# Patient Record
Sex: Female | Born: 2000 | Race: White | Hispanic: Yes | Marital: Single
Health system: Southern US, Community
[De-identification: ages and names within clinical notes are randomized; demographics above are authoritative.]

## PROBLEM LIST (undated history)

## (undated) DIAGNOSIS — F419 Anxiety disorder, unspecified: Secondary | ICD-10-CM

## (undated) DIAGNOSIS — K219 Gastro-esophageal reflux disease without esophagitis: Secondary | ICD-10-CM

## (undated) DIAGNOSIS — E282 Polycystic ovarian syndrome: Secondary | ICD-10-CM

## (undated) DIAGNOSIS — T7840XA Allergy, unspecified, initial encounter: Secondary | ICD-10-CM

## (undated) DIAGNOSIS — J302 Other seasonal allergic rhinitis: Secondary | ICD-10-CM

## (undated) HISTORY — DX: Polycystic ovarian syndrome: E28.2

## (undated) HISTORY — DX: Anxiety disorder, unspecified: F41.9

## (undated) HISTORY — DX: Allergy, unspecified, initial encounter: T78.40XA

## (undated) HISTORY — DX: Gastro-esophageal reflux disease without esophagitis: K21.9

## (undated) HISTORY — DX: Other seasonal allergic rhinitis: J30.2

---

## 2019-05-02 LAB — BASIC METABOLIC PANEL
BUN: 12 (ref 4–21)
CO2: 24 — AB (ref 13–22)
Chloride: 104 (ref 99–108)
Creatinine: 0.7 (ref 0.5–1.1)
Glucose: 100
Potassium: 4.2 (ref 3.4–5.3)
Sodium: 139 (ref 137–147)

## 2019-05-02 LAB — CBC AND DIFFERENTIAL
HCT: 40 (ref 36–46)
Hemoglobin: 13.3 (ref 12.0–16.0)
Platelets: 401 — AB (ref 150–399)
WBC: 6.9

## 2019-05-02 LAB — LIPID PANEL
Cholesterol: 150 (ref 0–200)
HDL: 37 (ref 35–70)
LDL Cholesterol: 93
Triglycerides: 108 (ref 40–160)

## 2019-05-02 LAB — COMPREHENSIVE METABOLIC PANEL
Albumin: 3.9 (ref 3.5–5.0)
Calcium: 9.2 (ref 8.7–10.7)

## 2019-05-02 LAB — HEPATIC FUNCTION PANEL
ALT: 29 (ref 7–35)
AST: 18 (ref 13–35)
Alkaline Phosphatase: 45 (ref 25–125)
Bilirubin, Total: 0.3

## 2019-05-02 LAB — CBC: RBC: 4.61 (ref 3.87–5.11)

## 2019-05-02 LAB — TSH: TSH: 4.27 (ref 0.41–5.90)

## 2019-07-19 ENCOUNTER — Ambulatory Visit: Payer: Self-pay | Admitting: Family Medicine

## 2019-07-20 ENCOUNTER — Ambulatory Visit: Payer: Self-pay | Admitting: Family Medicine

## 2019-10-03 ENCOUNTER — Ambulatory Visit (INDEPENDENT_AMBULATORY_CARE_PROVIDER_SITE_OTHER): Payer: Managed Care, Other (non HMO) | Admitting: Family Medicine

## 2019-10-03 ENCOUNTER — Other Ambulatory Visit: Payer: Self-pay

## 2019-10-03 ENCOUNTER — Encounter: Payer: Self-pay | Admitting: Family Medicine

## 2019-10-03 VITALS — BP 128/72 | HR 78 | Temp 98.3°F | Ht 70.0 in | Wt 258.8 lb

## 2019-10-03 DIAGNOSIS — F419 Anxiety disorder, unspecified: Secondary | ICD-10-CM

## 2019-10-03 DIAGNOSIS — K219 Gastro-esophageal reflux disease without esophagitis: Secondary | ICD-10-CM

## 2019-10-03 DIAGNOSIS — M25511 Pain in right shoulder: Secondary | ICD-10-CM | POA: Diagnosis not present

## 2019-10-03 DIAGNOSIS — M67432 Ganglion, left wrist: Secondary | ICD-10-CM

## 2019-10-03 DIAGNOSIS — Z23 Encounter for immunization: Secondary | ICD-10-CM

## 2019-10-03 DIAGNOSIS — E282 Polycystic ovarian syndrome: Secondary | ICD-10-CM

## 2019-10-03 DIAGNOSIS — L309 Dermatitis, unspecified: Secondary | ICD-10-CM | POA: Diagnosis not present

## 2019-10-03 DIAGNOSIS — J302 Other seasonal allergic rhinitis: Secondary | ICD-10-CM | POA: Insufficient documentation

## 2019-10-03 MED ORDER — MELOXICAM 7.5 MG PO TABS: 7.5000 mg | ORAL_TABLET | Freq: Every day | ORAL | 0 refills | Status: DC

## 2019-10-03 MED ORDER — OMEPRAZOLE 20 MG PO CPDR: 20.0000 mg | DELAYED_RELEASE_CAPSULE | Freq: Every day | ORAL | 3 refills | Status: DC

## 2019-10-03 NOTE — Progress Notes (Signed)
Tamara Jimenez DOB: 27-Mar-2000 Encounter date: 10/03/2019  This is a 19 y.o. female who presents to establish care. Chief Complaint  Patient presents with  . New Patient (Initial Visit)    History of present illness: Moved from [place] a few months ago.   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   Acid reflux - she is pretty sure she has this no matter what she eats - burping. Before used to be more with acidic foods and now pretty much anything. pepcid helps. Just burping. Feels more acid with tomato based.   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 When sleeping on shoulder feels deep pain and starts in shoulder but then throbs down arm. No numbness, tingling. Sometimes feels a little weakness just if carrying bag. No neck pain. Bothering her since July. When shoulder starts to hurt she takes tylenol which helps after awhile. If she sleeps on left side, shoulder does ok.   Allergic to dogs, cats, pecans, dust. Follows with allergy regularly. She is taking levocetirizine, montelukast, flonase. Does have albuterol, but has never used inhaler. Just given to her in case resp flare from allergies.   Mom passed when she was 89 y/o. Moved to [place] with Dad. Dad was on dialysis, had diabetes. Found father deceased in last year. She has anxiety, but feels she has some form of depression. Also been told some PTSD. Has regularly been in therapy. Sleeping ok.   Has lost almost 50lbs since she moved here. She has cut out some of the binge eating that she was doing; eating healthier in general and eating less. She walks dog daily and gets a lot of activity at work.  Past Medical History:  Diagnosis Date  . Allergy   . Anxiety   . GERD (gastroesophageal reflux disease)   . PCOS (polycystic ovarian syndrome)   . Seasonal allergies    History reviewed. No pertinent surgical history. Allergies  Allergen Reactions  . Dairy Aid [Lactase] Diarrhea    Current Meds  Medication Sig  . albuterol (VENTOLIN HFA) 108 (90 Base) MCG/ACT inhaler Inhale into the lungs every 6 (six) hours as needed for wheezing or shortness of breath.  Marland Kitchen azelastine (OPTIVAR) 0.05 % ophthalmic solution 1 drop 2 (two) times daily.  Marland Kitchen EPINEPHrine 0.3 mg/0.3 mL IJ SOAJ injection Inject 0.3 mg into the muscle as needed for anaphylaxis.  Marland Kitchen levocetirizine (XYZAL) 5 MG tablet Take 5 mg by mouth every evening.  . montelukast (SINGULAIR) 10 MG tablet Take 10 mg by mouth at bedtime.  . [DISCONTINUED] fluticasone (FLOVENT DISKUS) 50 MCG/BLIST diskus inhaler Inhale 1 puff into the lungs 2 (two) times daily.   Social History   Tobacco Use  . Smoking status: Not on file  Substance Use Topics  . Alcohol use: Not on file   Family History  Problem Relation Age of Onset  . Diabetes Mother   . Heart attack Mother 44  . Hypertension Mother   . Miscarriages / [place] Mother   . Diabetes Father   . Kidney failure Father   . Heart disease Father   . Diabetes Maternal Grandmother   . Hypertension Maternal Grandmother   . Diabetes Paternal Grandmother   . Hypertension Paternal Grandmother      Review of Systems  Constitutional: Negative for chills, fatigue and fever.  Respiratory: Negative for cough, chest tightness, shortness of breath and wheezing.   Cardiovascular: Negative for chest pain, palpitations and leg  swelling.  Gastrointestinal: Negative for abdominal pain, blood in stool, constipation, nausea and vomiting.  Musculoskeletal: Positive for arthralgias.    Objective:  BP 128/72 (BP Location: Right Arm, Patient Position: Sitting, Cuff Size: Large)   Pulse 78   Temp 98.3 F (36.8 C) (Oral)   Ht 5\' 10"  (1.778 m)   Wt 258 lb 12.8 oz (117.4 kg)   LMP 07/21/2019   SpO2 96%   BMI 37.13 kg/m   Weight: 258 lb 12.8 oz (117.4 kg)   BP Readings from Last 3 Encounters:  10/03/19 128/72   Wt Readings from Last 3 Encounters:  10/03/19 258 lb 12.8 oz (117.4  kg)    Physical Exam Constitutional:      General: She is not in acute distress.    Appearance: She is well-developed.  Cardiovascular:     Rate and Rhythm: Normal rate and regular rhythm.     Heart sounds: Normal heart sounds. No murmur heard.  No friction rub.  Pulmonary:     Effort: Pulmonary effort is normal. No respiratory distress.     Breath sounds: Normal breath sounds. No wheezing or rales.  Abdominal:     General: Bowel sounds are normal. There is no distension.     Palpations: Abdomen is soft.     Tenderness: There is no abdominal tenderness. There is no guarding.  Musculoskeletal:     Right lower leg: No edema.     Left lower leg: No edema.     Comments: Normal shoulder exam bilat. She has full rom of shoulders. Full strength. There is minimal discomfort with posterior and extension of shoulder, but otherwise unable to elicit pain. No external abnormality or bony tenderness.  Skin:    Comments: Ganglion cyst left wrist  Neurological:     Mental Status: She is alert and oriented to person, place, and time.  Psychiatric:        Mood and Affect: Mood normal.        Behavior: Behavior normal.        Thought Content: Thought content normal.     Assessment/Plan:  1. Gastroesophageal reflux disease, unspecified whether esophagitis present Trial prilosec 20mg  x 1 month.  2. Eczema, unspecified type Stable without medication. Ok to send in steroid cream if needed for flares in future. Was using hydrocortisone from derm in past.  3. Flu vaccine need - Flu Vaccine QUAD 6+ mos PF IM (Fluarix Quad PF)  4. Right shoulder pain, unspecified chronicity Trial meloxicam x 2 weeks. Trying to avoid regular anti-inflammatories due to acid reflux. Take this with the omeprazole. Don't lay on right shoulder for sleep. Let me know if no improvement in 2 weeks time. Consider xray.  5. Ganglion cyst of dorsum of left wrist Benign; not painful. Let 10/05/19 know if enlarging or becomes  tender.  6. PCOS (polycystic ovarian syndrome) Not been on medication for this in the past; she is working on healthier eating, weight loss. We will monitor periods for regularity.  7. Seasonal allergies Continue with Singulair, Xyzal  8. Anxiety Currently stable.  Numbers given for [facility].  We will continue to discuss this and consider treatment if needed.  She has had a lot of stress including the loss of both parents short timeframe.  She does have a good support system with family in this area.  She is resilient and has gone on to work and complete schooling as well as work on healthier lifestyle.  Return in about 3  months (around 01/02/2020) for physical exam.  Theodis Shove, MD

## 2019-10-03 NOTE — Patient Instructions (Addendum)
Let me know if you don't hear from me in 2 weeks regarding gynecology note/lab review  Take the omeprazole 30 minutes prior to dinner.

## 2019-10-05 ENCOUNTER — Encounter: Payer: Self-pay | Admitting: Family Medicine

## 2019-10-08 ENCOUNTER — Telehealth: Payer: Self-pay | Admitting: *Deleted

## 2019-10-08 MED ORDER — FLUTICASONE PROPIONATE 50 MCG/ACT NA SUSP: 2.0000 | Freq: Every day | NASAL | 6 refills | Status: AC

## 2019-10-08 NOTE — Telephone Encounter (Signed)
-----   Message from Wynn Banker, MD sent at 10/08/2019  2:46 PM EDT ----- Can you please call her to set up a visit to discuss medication for anxiety?  Virtual or phone visit is fine.  She is interested in starting something and I do not believe she is taking anything before, so wanted to talk through options with her before prescribing something.

## 2019-10-08 NOTE — Telephone Encounter (Signed)
Left a detailed message at the pts cell number to schedule an appt as below.   

## 2019-10-18 ENCOUNTER — Ambulatory Visit: Payer: Managed Care, Other (non HMO) | Admitting: Psychology

## 2019-10-26 ENCOUNTER — Telehealth: Payer: Managed Care, Other (non HMO) | Admitting: Family Medicine

## 2019-10-26 NOTE — Progress Notes (Deleted)
Virtual Visit via Video Note  I connected with Tamara Jimenez  on 10/26/19 at 11:30 AM EDT by a video enabled telemedicine application and verified that I am speaking with the correct person using two identifiers.  Location patient: home Location provider: [facility]  [address] [place], [place] [address] Persons participating in the virtual visit: patient, provider  I discussed the limitations of evaluation and management by telemedicine and the availability of in person appointments. The patient expressed understanding and agreed to proceed.   Tamara Jimenez DOB: Apr 30, 2000 Encounter date: 10/26/2019  This is a 19 y.o. female who presents with No chief complaint on file.   History of present illness: She had a visit to establish care on 9/29.  We briefly talked about anxiety and depression at that visit since she had encountered significant loss with mother passing away at the age of 106 and father recently passing away.  She messaged me regarding anxiety treatment feeling that maybe she needed something additional to therapy. HPI   Allergies  Allergen Reactions  . Dairy Aid [Lactase] Diarrhea   No outpatient medications have been marked as taking for the 10/26/19 encounter (Appointment) with Wynn Banker, MD.    Review of Systems  Objective:  There were no vitals taken for this visit.      BP Readings from Last 3 Encounters:  10/03/19 128/72   Wt Readings from Last 3 Encounters:  10/03/19 258 lb 12.8 oz (117.4 kg)    EXAM:  GENERAL: alert, oriented, appears well and in no acute distress  HEENT: atraumatic, conjunctiva clear, no obvious abnormalities on inspection of external nose and ears  NECK: normal movements of the head and neck  LUNGS: on inspection no signs of respiratory distress, breathing rate appears normal, no obvious gross SOB, gasping or wheezing  CV: no obvious cyanosis  MS: moves all visible extremities  without noticeable abnormality  PSYCH/NEURO: pleasant and cooperative, no obvious depression or anxiety, speech and thought processing grossly intact ***  Assessment/Plan  There are no diagnoses linked to this encounter.     I discussed the assessment and treatment plan with the patient. The patient was provided an opportunity to ask questions and all were answered. The patient agreed with the plan and demonstrated an understanding of the instructions.   The patient was advised to call back or seek an in-person evaluation if the symptoms worsen or if the condition fails to improve as anticipated.  I provided *** minutes of non-face-to-face time during this encounter.   Theodis Shove, MD

## 2019-10-31 ENCOUNTER — Encounter: Payer: Self-pay | Admitting: Family Medicine

## 2019-10-31 ENCOUNTER — Ambulatory Visit: Payer: Managed Care, Other (non HMO) | Admitting: Psychology

## 2019-10-31 DIAGNOSIS — M25511 Pain in right shoulder: Secondary | ICD-10-CM

## 2019-11-01 ENCOUNTER — Ambulatory Visit: Payer: Managed Care, Other (non HMO)

## 2019-11-01 DIAGNOSIS — M25511 Pain in right shoulder: Secondary | ICD-10-CM

## 2019-11-01 MED ORDER — OMEPRAZOLE 20 MG PO CPDR
20.0000 mg | DELAYED_RELEASE_CAPSULE | Freq: Two times a day (BID) | ORAL | 2 refills | Status: DC
Start: 2019-11-01 — End: 2019-12-14

## 2019-11-07 ENCOUNTER — Other Ambulatory Visit: Payer: Managed Care, Other (non HMO)

## 2019-11-07 ENCOUNTER — Other Ambulatory Visit: Payer: Self-pay | Admitting: Family Medicine

## 2019-11-07 ENCOUNTER — Ambulatory Visit (INDEPENDENT_AMBULATORY_CARE_PROVIDER_SITE_OTHER): Payer: Managed Care, Other (non HMO)

## 2019-11-07 ENCOUNTER — Other Ambulatory Visit: Payer: Self-pay

## 2019-11-07 DIAGNOSIS — M25511 Pain in right shoulder: Secondary | ICD-10-CM

## 2019-11-14 ENCOUNTER — Telehealth (INDEPENDENT_AMBULATORY_CARE_PROVIDER_SITE_OTHER): Payer: Managed Care, Other (non HMO) | Admitting: Family Medicine

## 2019-11-14 VITALS — Wt 251.0 lb

## 2019-11-14 DIAGNOSIS — F419 Anxiety disorder, unspecified: Secondary | ICD-10-CM

## 2019-11-14 DIAGNOSIS — M25511 Pain in right shoulder: Secondary | ICD-10-CM

## 2019-11-14 MED ORDER — CITALOPRAM HYDROBROMIDE 20 MG PO TABS: 20.0000 mg | ORAL_TABLET | Freq: Every day | ORAL | 2 refills | Status: DC

## 2019-11-14 NOTE — Progress Notes (Signed)
Virtual Visit via Video Note  I connected with Tamara Jimenez  on 11/14/19 at  1:30 PM EST by a video enabled telemedicine application and verified that I am speaking with the correct person using two identifiers.  Location patient: home Location provider: [facility]  [address] [place], [place] [address] Persons participating in the virtual visit: patient, provider  I discussed the limitations of evaluation and management by telemedicine and the availability of in person appointments. The patient expressed understanding and agreed to proceed.   Tamara Jimenez DOB: 12/01/2000 Encounter date: 11/14/2019  This is a 19 y.o. female who presents with Chief Complaint  Patient presents with  . Shoulder Pain  . Anxiety    History of present illness: Things are going ok for her, but wanted to talk through normal xray results for shoulder.   Done taking meloxicam; wasn't really helping. Has trying to prop self on pillows to avoid turning/lying on shoulder. 2-4 weeks ago slept on shoulder and it was painful. Mostly at night that she notes it. 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 Feels achy - and just with lifting. If sitting still doesn't bother her. Sometimes painful with posterior reaching. Only weak when pain starts. Then it bothers her to even open and close fingers. Starts in lateral shoulder and then radiates down to hand. Pulses down arm. When it flares it will last for an hour and then go away. Has had numbness in fingers. Notes this in all of her fingers. Carrying purse on left shoulder. It is fairly intense when it comes - 7/10 scale. Has to stop what she is doing when it occurs.   Has still more severe moments of anxiety; worries when she has to take out trash. Has flash back/remembering scenes from finding dad. Tries to act more confident in times when she is anxious. Tries to stay focused. If chore takes longer then she is more worried. With visions  she is getting; she tries to ignore visions. Brain wants her to think about it; hard to push out of brain. Weighted blanket helps her fall asleep. Work is good; doesn't have issue with anxiety at work. Hasn't started school yet; just registering for classes. No excessive anxiety with regards to this.   Walking around at work a lot.   Allergies  Allergen Reactions  . Dairy Aid [Lactase] Diarrhea   No outpatient medications have been marked as taking for the 11/14/19 encounter (Video Visit) with Wynn Banker, MD.    Review of Systems  Constitutional: Negative for chills, fatigue and fever.  Respiratory: Negative for cough, chest tightness, shortness of breath and wheezing.   Cardiovascular: Negative for chest pain, palpitations and leg swelling.    Objective:  Wt 251 lb (113.9 kg)   BMI 36.01 kg/m   Weight: 251 lb (113.9 kg)   BP Readings from Last 3 Encounters:  10/03/19 128/72   Wt Readings from Last 3 Encounters:  11/14/19 251 lb (113.9 kg)  10/03/19 258 lb 12.8 oz (117.4 kg)    EXAM:  GENERAL: alert, oriented, appears well and in no acute distress  HEENT: atraumatic, conjunctiva clear, no obvious abnormalities on inspection of external nose and ears  NECK: normal movements of the head and neck  LUNGS: on inspection no signs of respiratory distress, breathing rate appears normal, no obvious gross SOB, gasping or wheezing  CV: no obvious cyanosis  MS: moves all visible extremities without noticeable abnormality  PSYCH/NEURO: pleasant and cooperative, no  obvious depression or anxiety, speech and thought processing grossly intact GAD 7 : Generalized Anxiety Score 11/14/2019  Nervous, Anxious, on Edge 1  Control/stop worrying 2  Worry too much - different things 1  Trouble relaxing 2  Restless 0  Easily annoyed or irritable 2  Afraid - awful might happen 2  Total GAD 7 Score 10  Anxiety Difficulty Somewhat difficult      Assessment/Plan  1. Right  shoulder pain, unspecified chronicity Pain sounds slightly more complicated at this point and I suspect some nerve involvement. I am going to send for further eval with sports med.  - Ambulatory referral to Sports Medicine  2. Anxiety She does have appointment with new therapist next week. I have asked her to let me know how this goes. Anxiety is prominent and we are going to try citalopram to help with some of more intrusive thoughts/anxiety control overall. She is using therapy techniques to help with coping. Discussed new medication(s) today with patient. Discussed potential side effects and patient verbalized understanding. She will update me in a couple of weeks to let me know how she is doing with medication and we will plan follow up pending this.      I discussed the assessment and treatment plan with the patient. The patient was provided an opportunity to ask questions and all were answered. The patient agreed with the plan and demonstrated an understanding of the instructions.   The patient was advised to call back or seek an in-person evaluation if the symptoms worsen or if the condition fails to improve as anticipated.  I provided 35 minutes of non-face-to-face time during this encounter.   Theodis Shove, MD

## 2019-11-22 ENCOUNTER — Ambulatory Visit (INDEPENDENT_AMBULATORY_CARE_PROVIDER_SITE_OTHER): Payer: Managed Care, Other (non HMO) | Admitting: Psychology

## 2019-11-22 DIAGNOSIS — F4312 Post-traumatic stress disorder, chronic: Secondary | ICD-10-CM

## 2019-11-27 ENCOUNTER — Encounter: Payer: Self-pay | Admitting: Family Medicine

## 2019-11-28 ENCOUNTER — Telehealth: Payer: Self-pay | Admitting: Family Medicine

## 2019-11-28 NOTE — Telephone Encounter (Signed)
Patient called and stated that she is returning the call, please advise. CB is [phone]

## 2019-12-05 ENCOUNTER — Encounter: Payer: Self-pay | Admitting: *Deleted

## 2019-12-06 ENCOUNTER — Ambulatory Visit: Payer: Managed Care, Other (non HMO) | Admitting: Psychology

## 2019-12-06 NOTE — Telephone Encounter (Signed)
See results note from 12/1.

## 2019-12-07 ENCOUNTER — Other Ambulatory Visit: Payer: Self-pay | Admitting: Family Medicine

## 2019-12-14 ENCOUNTER — Other Ambulatory Visit: Payer: Self-pay | Admitting: *Deleted

## 2019-12-14 MED ORDER — OMEPRAZOLE 20 MG PO CPDR
20.0000 mg | DELAYED_RELEASE_CAPSULE | Freq: Two times a day (BID) | ORAL | 0 refills | Status: DC
Start: 2019-12-14 — End: 2023-02-09

## 2019-12-14 NOTE — Telephone Encounter (Signed)
Rx done. 

## 2019-12-18 NOTE — Progress Notes (Signed)
[facility] [address] [place] [address] Phone: [phone] Subjective:   Tamara Jimenez, am serving as a scribe for Dr. Antoine Primas. This visit occurred during the SARS-CoV-2 public health emergency.  Safety protocols were in place, including screening questions prior to the visit, additional usage of staff PPE, and extensive cleaning of exam room while observing appropriate contact time as indicated for disinfecting solutions.   I'm seeing this patient by the request  of:  Wynn Banker, MD  CC: Right shoulder pain  LSL:[id number]  Tamara Jimenez is a 19 y.o. female coming in with complaint of right shoulder pain. Had xray 11/2019. Normal exam. 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 Patient notes pain while sleeping since July 2021. No longer works at [employer] clinic. Pain radiates down arm into hand. Used meloxicam from her PCP.   Xray 11/07/2019     Past Medical History:  Diagnosis Date  . Allergy   . Anxiety   . GERD (gastroesophageal reflux disease)   . PCOS (polycystic ovarian syndrome)   . Seasonal allergies    No past surgical history on file. Social History   Socioeconomic History  . Marital status: Single    Spouse name: Not on file  . Number of children: Not on file  . Years of education: Not on file  . Highest education level: Not on file  Occupational History  . Not on file  Tobacco Use  . Smoking status: Not on file  . Smokeless tobacco: Not on file  Substance and Sexual Activity  . Alcohol use: Not on file  . Drug use: Not on file  . Sexual activity: Not on file  Other Topics Concern  . Not on file  Social History Narrative  . Not on file   Social Determinants of Health   Financial Resource Strain: Not on file  Food Insecurity: Not on file  Transportation Needs: Not on file  Physical Activity: Not on file  Stress:  Not on file  Social Connections: Not on file   Allergies  Allergen Reactions  . Dairy Aid [Lactase] Diarrhea   Family History  Problem Relation Age of Onset  . Diabetes Mother   . Heart attack Mother 29  . Hypertension Mother   . Miscarriages / [place] Mother   . Diabetes Father   . Kidney failure Father   . Heart disease Father   . Diabetes Maternal Grandmother   . Hypertension Maternal Grandmother   . Diabetes Paternal Grandmother   . Hypertension Paternal Grandmother      Current Outpatient Medications (Cardiovascular):  Marland Kitchen  EPINEPHrine 0.3 mg/0.3 mL IJ SOAJ injection, Inject 0.3 mg into the muscle as needed for anaphylaxis.  Current Outpatient Medications (Respiratory):  .  albuterol (VENTOLIN HFA) 108 (90 Base) MCG/ACT inhaler, Inhale into the lungs every 6 (six) hours as needed for wheezing or shortness of breath. .  fluticasone (FLONASE) 50 MCG/ACT nasal spray, Place 2 sprays into both nostrils daily. Marland Kitchen  levocetirizine (XYZAL) 5 MG tablet, Take 5 mg by mouth every evening. .  montelukast (SINGULAIR) 10 MG tablet, Take 10 mg by mouth at bedtime.    Current Outpatient Medications (Other):  .  azelastine (OPTIVAR) 0.05 % ophthalmic solution, 1 drop 2 (two) times daily. .  citalopram (CELEXA) 20 MG tablet, TAKE 1 TABLET BY MOUTH EVERY DAY .  omeprazole (PRILOSEC) 20 MG  capsule, Take 1 capsule (20 mg total) by mouth 2 (two) times daily before a meal.   Reviewed prior external information including notes and imaging from  primary care provider As well as notes that were available from care everywhere and other healthcare systems.  Past medical history, social, surgical and family history all reviewed in electronic medical record.  No pertanent information unless stated regarding to the chief complaint.   Review of Systems:  No headache, visual changes, nausea, vomiting, diarrhea, constipation, dizziness, abdominal pain, skin rash, fevers, chills, night sweats,  weight loss, swollen lymph nodes, body aches, joint swelling, chest pain, shortness of breath, mood changes. POSITIVE muscle aches  Objective  Blood pressure 124/76, pulse 70, height 5\' 10"  (1.778 m), weight 254 lb (115.2 kg), SpO2 99 %.   General: No apparent distress alert and oriented x3 mood and affect normal, dressed appropriately.  HEENT: Pupils equal, extraocular movements intact  Respiratory: Patient's speak in full sentences and does not appear short of breath  Cardiovascular: No lower extremity edema, non tender, no erythema  Neuro: Cranial nerves II through XII are intact, neurovascularly intact in all extremities with 2+ DTRs and 2+ pulses.  Gait normal with good balance and coordination.  MSK:   Shoulder: Right Inspection reveals no abnormalities, atrophy or asymmetry. Palpation is normal with no tenderness over AC joint or bicipital groove. ROM is full in all planes passively. Rotator cuff strength normal throughout. signs of impingement with positive Neer and Hawkin's tests, but negative empty can sign. Speeds and Yergason's tests normal. No labral pathology noted with negative Obrien's, negative clunk and good stability. Normal scapular function observed. No painful arc and no drop arm sign. No apprehension sign  MSK performed of: Right This study was ordered, performed, and interpreted by [place] D.O.  Shoulder:   Supraspinatus:  Appears normal on long and transverse views, very mild bursal bulge seen with shoulder abduction on impingement view. AC joint:  Capsule undistended, no geyser sign. Glenohumeral Joint:  Appears normal without effusion. Glenoid Labrum:  Intact without visualized tears. Biceps Tendon:  Appears normal on long and transverse views, no fraying of tendon, tendon located in intertubercular groove, no subluxation with shoulder internal or external rotation.  Impression: Mild subacromial bursitis versus very mild rotator cuff  tendinitis  [id number]; 15 additional minutes spent for Therapeutic exercises as stated in above notes.  This included exercises focusing on stretching, strengthening, with significant focus on eccentric aspects.   Long term goals include an improvement in range of motion, strength, endurance as well as avoiding reinjury. Patient's frequency would include in 1-2 times a day, 3-5 times a week for a duration of 6-12 weeks. Shoulder Exercises that included:  Basic scapular stabilization to include adduction and depression of scapula Scaption, focusing on proper movement and good control Internal and External rotation utilizing a theraband, with elbow tucked at side entire time Rows with theraband    Proper technique shown and discussed handout in great detail with ATC.  All questions were discussed and answered.        Impression and Recommendations:     The above documentation has been reviewed and is accurate and complete Terrilee Files, DO

## 2019-12-19 ENCOUNTER — Ambulatory Visit: Payer: Self-pay

## 2019-12-19 ENCOUNTER — Ambulatory Visit: Payer: Managed Care, Other (non HMO) | Admitting: Family Medicine

## 2019-12-19 ENCOUNTER — Encounter: Payer: Self-pay | Admitting: Family Medicine

## 2019-12-19 ENCOUNTER — Other Ambulatory Visit: Payer: Self-pay

## 2019-12-19 VITALS — BP 124/76 | HR 70 | Ht 70.0 in | Wt 254.0 lb

## 2019-12-19 DIAGNOSIS — M25511 Pain in right shoulder: Secondary | ICD-10-CM

## 2019-12-19 NOTE — Assessment & Plan Note (Signed)
Right shoulder pain.  Patient does have more of a mild tendinitis.  I do think some of his posterior medial cuneiform exercises given, discussed vitamin D and topical anti-inflammatories.  Discussed icing regimen.  Follow-up in 6 weeks

## 2019-12-19 NOTE — Patient Instructions (Signed)
Vit D 2000IU daily Voltaren gel 2x a day as needed, fingertip sized amount Exercises 3x a week See me in 6 weeks

## 2019-12-20 ENCOUNTER — Telehealth: Payer: Self-pay | Admitting: *Deleted

## 2019-12-20 ENCOUNTER — Ambulatory Visit: Payer: Managed Care, Other (non HMO) | Admitting: Psychology

## 2019-12-20 NOTE — Telephone Encounter (Signed)
[facility] faxed a new Rx request for Estarylla.  Message sent to PCP.

## 2019-12-24 NOTE — Telephone Encounter (Signed)
She follows with [facility]? They should be filling this for her. We don't even have this on her med list. Can she have them fill this if they will be the prescribing group? If for some reason she is no longer seeing them and she needs an rx AND has been taking regularly, then I am ok with doing a 90 day supply w 3 refills.

## 2019-12-24 NOTE — Telephone Encounter (Signed)
Left a message for the pt to return my call.  

## 2019-12-26 NOTE — Telephone Encounter (Signed)
Patient called back, was informed of the message below and agreed to contact [facility] for refills.

## 2020-01-02 ENCOUNTER — Encounter: Payer: Managed Care, Other (non HMO) | Admitting: Family Medicine

## 2020-01-31 ENCOUNTER — Ambulatory Visit: Payer: Managed Care, Other (non HMO) | Admitting: Family Medicine

## 2020-02-19 ENCOUNTER — Telehealth: Payer: Self-pay | Admitting: Family Medicine

## 2020-02-19 MED ORDER — CITALOPRAM HYDROBROMIDE 20 MG PO TABS
20.0000 mg | ORAL_TABLET | Freq: Every day | ORAL | 0 refills | Status: DC
Start: 2020-02-19 — End: 2020-05-26

## 2020-02-19 NOTE — Telephone Encounter (Signed)
Rx done-see prior note. 

## 2020-02-19 NOTE — Telephone Encounter (Signed)
Rx done. 

## 2020-02-19 NOTE — Telephone Encounter (Signed)
Pt would like a refill for citalopram (CELEXA) 20 MG tablet   Send to: [facility] - [place], [place] - [address] AT [facility]  [address] [place], [place] [address]  Phone:  [phone] Fax:  [phone]

## 2020-02-19 NOTE — Telephone Encounter (Signed)
Pt call and need a refilll for 90 day supply  on  citalopram (CELEXA) 20 MG tablet sent to  [facility] [place], [place] - [address] AT [facility] to [facility] Phone:  [phone]  Fax:  [phone]

## 2020-02-20 ENCOUNTER — Encounter: Payer: Managed Care, Other (non HMO) | Admitting: Family Medicine

## 2020-04-07 ENCOUNTER — Encounter: Payer: Managed Care, Other (non HMO) | Admitting: Family Medicine

## 2020-05-25 ENCOUNTER — Other Ambulatory Visit: Payer: Self-pay | Admitting: Family Medicine

## 2020-06-06 ENCOUNTER — Telehealth: Payer: Self-pay | Admitting: Family Medicine

## 2020-06-06 NOTE — Telephone Encounter (Signed)
FYI:  Pt is calling to let us know that she will no longer be coming to our office due to her new insurance but she was wondering if she could get a refill on her Rx citalopram (CELEXA) 20 MG she is aware that medication was called in (05/26/2020) to her pharmacy and she should call the pharmacy to check on the status with them.  Pt stated that she would check with them.

## 2020-06-09 NOTE — Telephone Encounter (Signed)
Noted  

## 2020-07-29 ENCOUNTER — Other Ambulatory Visit: Payer: Self-pay | Admitting: Family Medicine

## 2020-10-21 ENCOUNTER — Ambulatory Visit: Payer: Managed Care, Other (non HMO) | Admitting: Advanced Practice Midwife

## 2020-11-20 ENCOUNTER — Ambulatory Visit: Payer: Self-pay | Admitting: Allergy

## 2020-11-24 ENCOUNTER — Ambulatory Visit: Payer: Managed Care, Other (non HMO) | Admitting: Advanced Practice Midwife

## 2020-11-24 NOTE — Progress Notes (Deleted)
   Subjective:     Tamara Jimenez is a 20 y.o. female here at [facility] *** for a routine exam.  Current complaints: ***.  Personal health questionnaire reviewed: {yes/no:9010}.  Do you have a primary care provider? *** Do you feel safe at home? ***  Flowsheet Row Office Visit from 10/03/2019 in [facility] at [facility] Total Score 0       Health Maintenance Due  Topic Date Due   HPV VACCINES (1 - 2-dose series) Never done   HIV Screening  Never done   Hepatitis C Screening  Never done   TETANUS/TDAP  Never done   PAP-Cervical Cytology Screening  Never done   PAP SMEAR-Modifier  Never done   COVID-19 Vaccine (2 - Moderna series) 10/31/2019   INFLUENZA VACCINE  08/04/2020     Risk factors for chronic health problems: Smoking: Alchohol/how much: Pt BMI: There is no height or weight on file to calculate BMI.   Gynecologic History No LMP recorded. Contraception: {method:5051} Last Pap: ***. Results were: {norm/abn:16337} Last mammogram: ***. Results were: {norm/abn:16337}  Obstetric History OB History  No obstetric history on file.     {Common ambulatory SmartLinks:19316}  Review of Systems {ros; complete:30496}    Objective:   There were no vitals taken for this visit. VS reviewed, nursing note reviewed,  Constitutional: well developed, well nourished, no distress HEENT: normocephalic CV: normal rate Pulm/chest wall: normal effort Breast Exam:  ***Deferred with low risks and shared decision making, discussed recommendation to start mammogram between 40-50 yo/ exam performed: right breast normal without mass, skin or nipple changes or axillary nodes, left breast normal without mass, skin or nipple changes or axillary nodes Abdomen: soft Neuro: alert and oriented x 3 Skin: warm, dry Psych: affect normal Pelvic exam: ***Deferred/ Performed: Cervix pink, visually closed, without lesion, scant white creamy discharge, vaginal walls and external  genitalia normal Bimanual exam: Cervix 0/long/high, firm, anterior, neg CMT, uterus nontender, nonenlarged, adnexa without tenderness, enlargement, or mass       Assessment/Plan:   1. Well woman exam with routine gynecological exam ***       Follow up in: {1-10:13787:::0} {time; units:19136:::0} or as needed.   Sharen Counter, CNM 1:14 PM

## 2021-01-20 NOTE — Progress Notes (Deleted)
New Patient Note  RE: Tamara Jimenez MRN: [id number] DOB: 03-Apr-2000 Date of Office Visit: 01/21/2021  Consult requested by: Wynn Banker, MD Primary care provider: Wynn Banker, MD  Chief Complaint: No chief complaint on file.  History of Present Illness: I had the pleasure of seeing Tamara Jimenez for initial evaluation at the [facility] on 01/20/2021. She is a 21 y.o. female, who is referred here by Wynn Banker, MD for the evaluation of ***.  ***  Assessment and Plan: [name] is a 21 y.o. female with: No problem-specific Assessment & Plan notes found for this encounter.  No follow-ups on file.  No orders of the defined types were placed in this encounter.  Lab Orders  No laboratory test(s) ordered today    Other allergy screening: Asthma: {Blank single:19197::"yes","no"} Rhino conjunctivitis: {Blank single:19197::"yes","no"} Food allergy: {Blank single:19197::"yes","no"} Medication allergy: {Blank single:19197::"yes","no"} Hymenoptera allergy: {Blank single:19197::"yes","no"} Urticaria: {Blank single:19197::"yes","no"} Eczema:{Blank single:19197::"yes","no"} History of recurrent infections suggestive of immunodeficency: {Blank single:19197::"yes","no"}  Diagnostics: Spirometry:  Tracings reviewed. Her effort: {Blank single:19197::"Good reproducible efforts.","It was hard to get consistent efforts and there is a question as to whether this reflects a maximal maneuver.","Poor effort, data can not be interpreted."} FVC: ***L FEV1: ***L, ***% predicted FEV1/FVC ratio: ***% Interpretation: {Blank single:19197::"Spirometry consistent with mild obstructive disease","Spirometry consistent with moderate obstructive disease","Spirometry consistent with severe obstructive disease","Spirometry consistent with possible restrictive disease","Spirometry consistent with mixed obstructive and restrictive disease","Spirometry  uninterpretable due to technique","Spirometry consistent with normal pattern","No overt abnormalities noted given today's efforts"}.  Please see scanned spirometry results for details.  Skin Testing: {Blank single:19197::"Select foods","Environmental allergy panel","Environmental allergy panel and select foods","Food allergy panel","None","Deferred due to recent antihistamines use"}. *** Results discussed with patient/family.   Past Medical History: Patient Active Problem List   Diagnosis Date Noted   Right shoulder pain 12/19/2019   PCOS (polycystic ovarian syndrome) 10/03/2019   Seasonal allergies 10/03/2019   Eczema 10/03/2019   Anxiety 10/03/2019   Past Medical History:  Diagnosis Date   Allergy    Anxiety    GERD (gastroesophageal reflux disease)    PCOS (polycystic ovarian syndrome)    Seasonal allergies    Past Surgical History: No past surgical history on file. Medication List:  Current Outpatient Medications  Medication Sig Dispense Refill   albuterol (VENTOLIN HFA) 108 (90 Base) MCG/ACT inhaler Inhale into the lungs every 6 (six) hours as needed for wheezing or shortness of breath.     azelastine (OPTIVAR) 0.05 % ophthalmic solution 1 drop 2 (two) times daily.     citalopram (CELEXA) 20 MG tablet TAKE 1 TABLET BY MOUTH EVERY DAY 90 tablet 1   EPINEPHrine 0.3 mg/0.3 mL IJ SOAJ injection Inject 0.3 mg into the muscle as needed for anaphylaxis.     fluticasone (FLONASE) 50 MCG/ACT nasal spray Place 2 sprays into both nostrils daily. 16 g 6   levocetirizine (XYZAL) 5 MG tablet Take 5 mg by mouth every evening.     montelukast (SINGULAIR) 10 MG tablet Take 10 mg by mouth at bedtime.     omeprazole (PRILOSEC) 20 MG capsule Take 1 capsule (20 mg total) by mouth 2 (two) times daily before a meal. 180 capsule 0   No current facility-administered medications for this visit.   Allergies: Allergies  Allergen Reactions   Dairy Aid [Tilactase] Diarrhea    Social History: Social History   Socioeconomic History   Marital status: Single    Spouse name: Not on file  Number of children: Not on file   Years of education: Not on file   Highest education level: Not on file  Occupational History   Not on file  Tobacco Use   Smoking status: Not on file   Smokeless tobacco: Not on file  Substance and Sexual Activity   Alcohol use: Not on file   Drug use: Not on file   Sexual activity: Not on file  Other Topics Concern   Not on file  Social History Narrative   Not on file   Social Determinants of Health   Financial Resource Strain: Not on file  Food Insecurity: Not on file  Transportation Needs: Not on file  Physical Activity: Not on file  Stress: Not on file  Social Connections: Not on file   Lives in a ***. Smoking: *** Occupation: ***  Environmental HistorySurveyor, minerals in the house: Copywriter, advertising in the family room: {Blank single:19197::"yes","no"} Carpet in the bedroom: {Blank single:19197::"yes","no"} Heating: {Blank single:19197::"electric","gas","heat pump"} Cooling: {Blank single:19197::"central","window","heat pump"} Pet: {Blank single:19197::"yes ***","no"}  Family History: Family History  Problem Relation Age of Onset   Diabetes Mother    Heart attack Mother 42   Hypertension Mother    Miscarriages / [place] Mother    Diabetes Father    Kidney failure Father    Heart disease Father    Diabetes Maternal Grandmother    Hypertension Maternal Grandmother    Diabetes Paternal Grandmother    Hypertension Paternal Grandmother    Problem                               Relation Asthma                                   *** Eczema                                *** Food allergy                          *** Allergic rhino conjunctivitis     ***  Review of Systems  Constitutional:  Negative for appetite change, chills, fever and unexpected weight  change.  HENT:  Negative for congestion and rhinorrhea.   Eyes:  Negative for itching.  Respiratory:  Negative for cough, chest tightness, shortness of breath and wheezing.   Cardiovascular:  Negative for chest pain.  Gastrointestinal:  Negative for abdominal pain.  Genitourinary:  Negative for difficulty urinating.  Skin:  Negative for rash.  Neurological:  Negative for headaches.   Objective: There were no vitals taken for this visit. There is no height or weight on file to calculate BMI. Physical Exam Vitals and nursing note reviewed.  Constitutional:      Appearance: Normal appearance. She is well-developed.  HENT:     Head: Normocephalic and atraumatic.     Right Ear: Tympanic membrane and external ear normal.     Left Ear: Tympanic membrane and external ear normal.     Nose: Nose normal.     Mouth/Throat:     Mouth: Mucous membranes are moist.     Pharynx: Oropharynx is clear.  Eyes:     Conjunctiva/sclera: Conjunctivae normal.  Cardiovascular:     Rate and Rhythm: Normal rate and regular  rhythm.     Heart sounds: Normal heart sounds. No murmur heard.   No friction rub. No gallop.  Pulmonary:     Effort: Pulmonary effort is normal.     Breath sounds: Normal breath sounds. No wheezing, rhonchi or rales.  Musculoskeletal:     Cervical back: Neck supple.  Skin:    General: Skin is warm.     Findings: No rash.  Neurological:     Mental Status: She is alert and oriented to person, place, and time.  Psychiatric:        Behavior: Behavior normal.  The plan was reviewed with the patient/family, and all questions/concerned were addressed.  It was my pleasure to see Cala BradfordKimberly today and participate in her care. Please feel free to contact me with any questions or concerns.  Sincerely,  Wyline MoodYoon Rosbel Buckner, DO Allergy & Immunology  [facility] [place] [place] office: [phone] [facility] office: [phone]

## 2021-01-21 ENCOUNTER — Ambulatory Visit: Payer: Self-pay | Admitting: Allergy

## 2021-02-20 ENCOUNTER — Encounter (HOSPITAL_BASED_OUTPATIENT_CLINIC_OR_DEPARTMENT_OTHER): Payer: Managed Care, Other (non HMO) | Admitting: Medical

## 2021-03-16 ENCOUNTER — Ambulatory Visit: Payer: Self-pay | Admitting: Allergy

## 2021-03-18 ENCOUNTER — Ambulatory Visit (HOSPITAL_BASED_OUTPATIENT_CLINIC_OR_DEPARTMENT_OTHER): Payer: Managed Care, Other (non HMO) | Admitting: Medical

## 2022-05-13 IMAGING — DX DG SHOULDER 2+V*R*
3 series · 3 of 3 positions shown · non-contrast
Comparison: None.

CLINICAL DATA: Right shoulder pain, numbness and tingling. No
reported injury.

EXAM:
RIGHT SHOULDER - 2+ VIEW

[shoulder internal rotation ap]
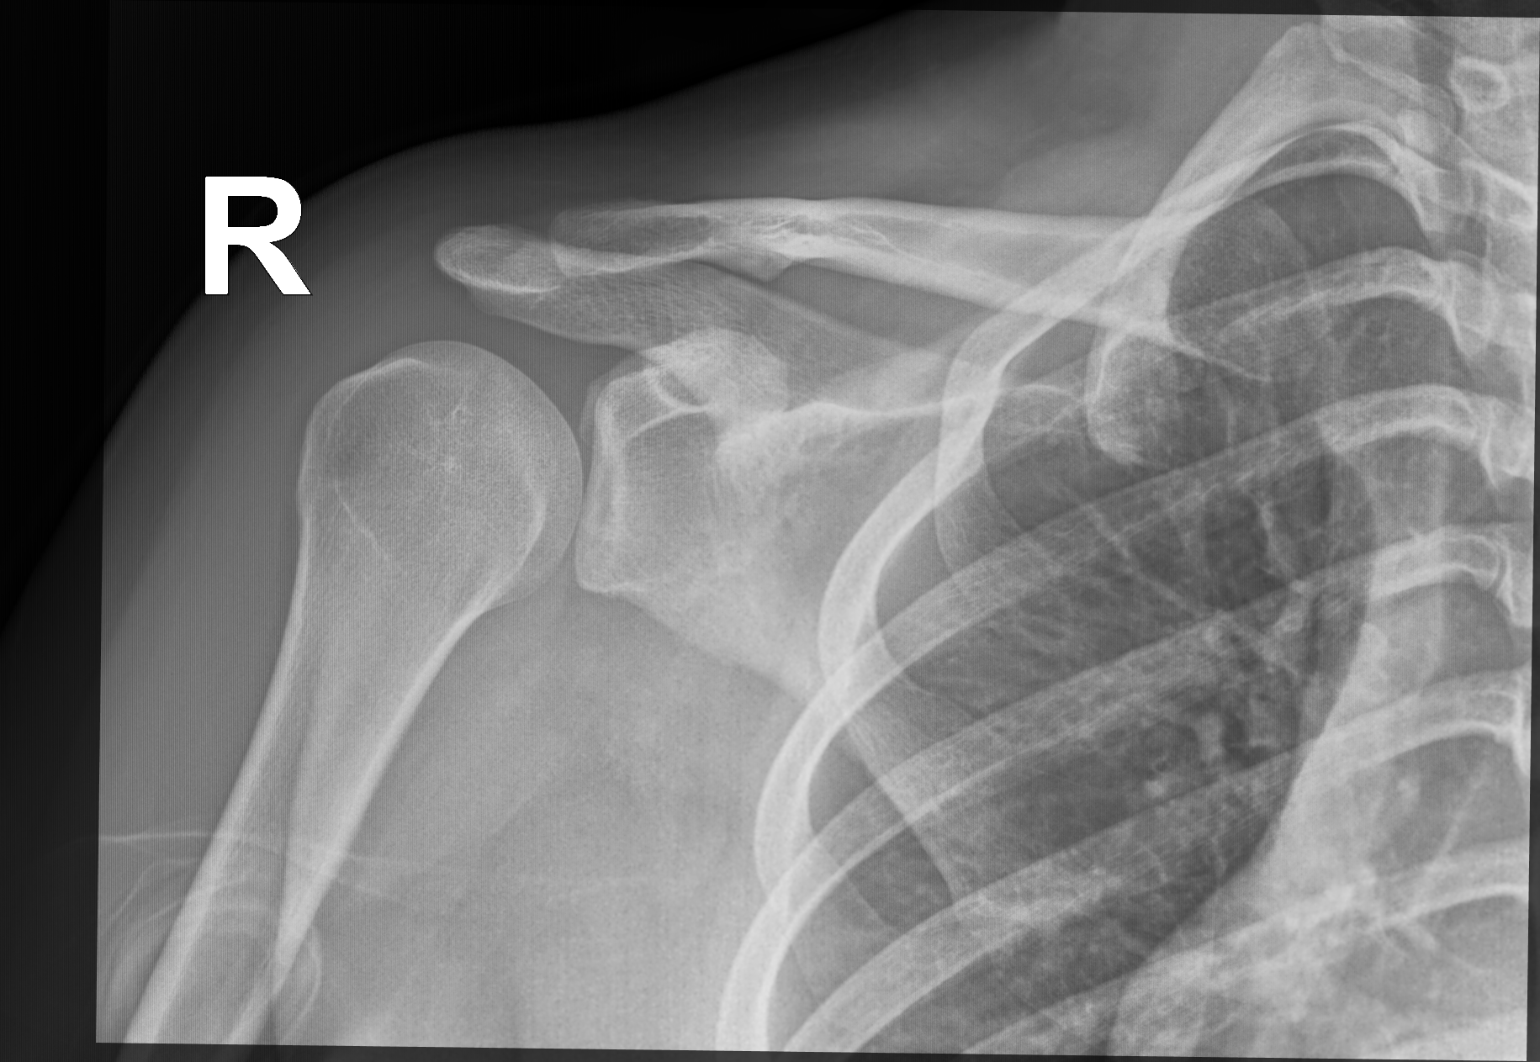

[shoulder (y view)]
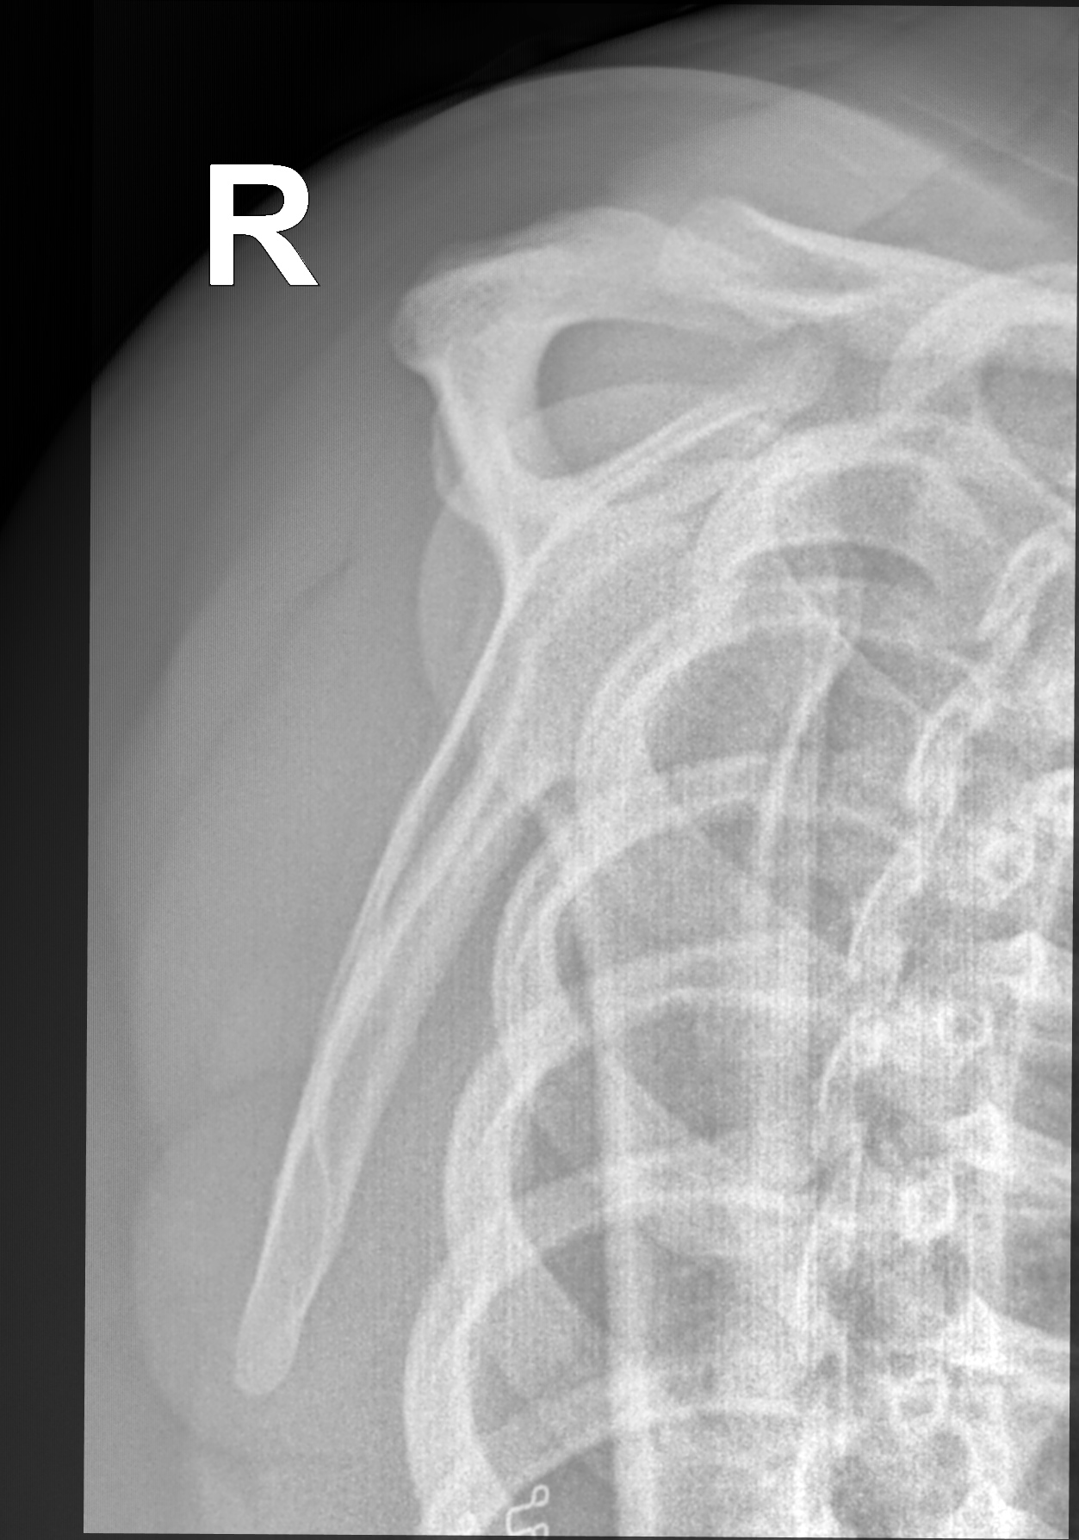

[shoulder (axial)]
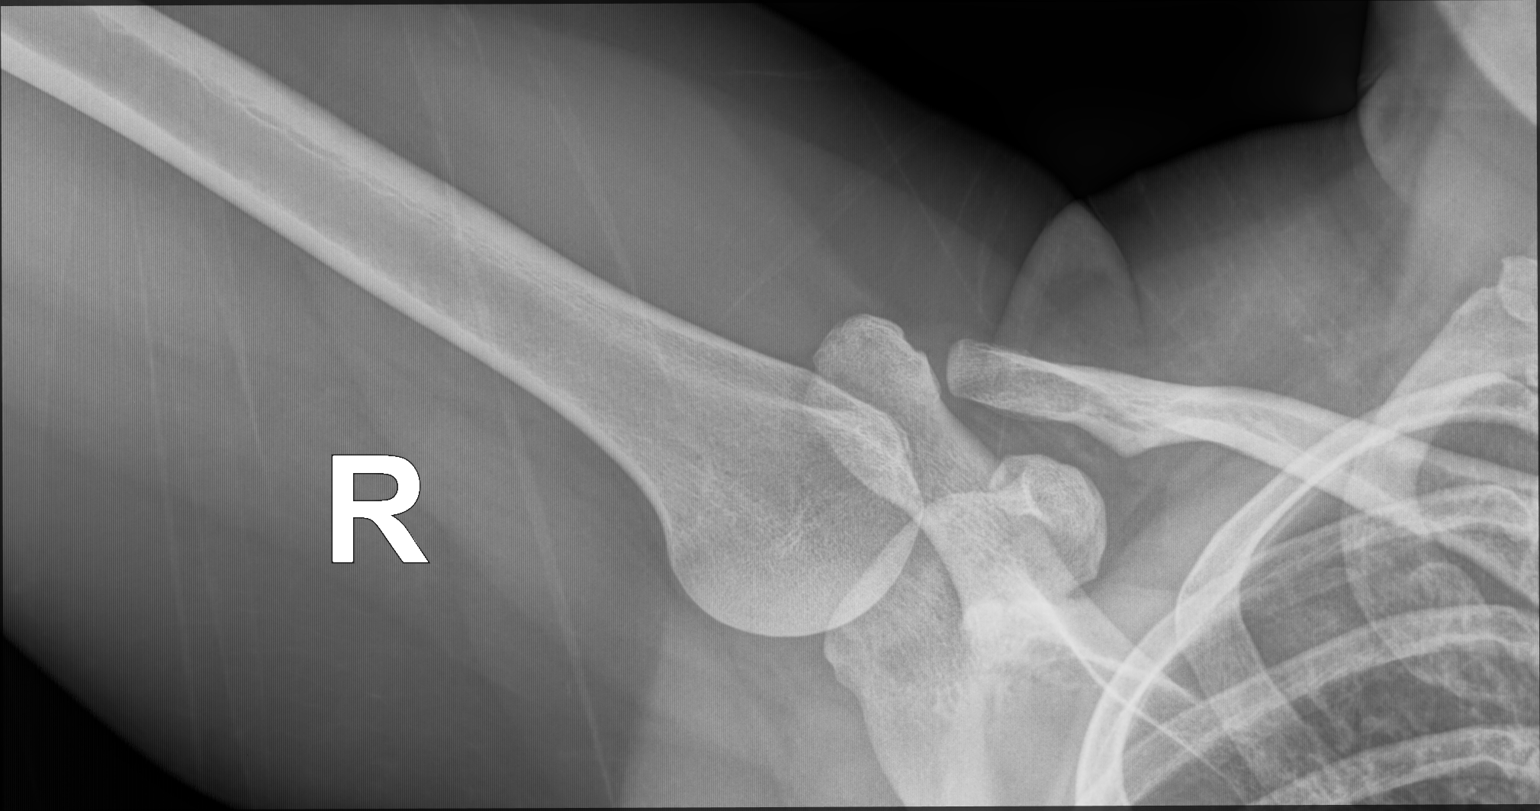

[3 of 3 positions shown; findings below may reference images not displayed]

FINDINGS: There is no evidence of fracture or dislocation. There is no
evidence of arthropathy or other focal bone abnormality. Soft
tissues are unremarkable.
IMPRESSION: Normal examination.

## 2023-02-09 ENCOUNTER — Encounter: Payer: Self-pay | Admitting: Internal Medicine

## 2023-02-09 ENCOUNTER — Ambulatory Visit (INDEPENDENT_AMBULATORY_CARE_PROVIDER_SITE_OTHER): Payer: Managed Care, Other (non HMO) | Admitting: Internal Medicine

## 2023-02-09 VITALS — BP 122/72 | HR 94 | Temp 97.9°F | Resp 18 | Ht 70.0 in | Wt 306.0 lb

## 2023-02-09 DIAGNOSIS — T781XXD Other adverse food reactions, not elsewhere classified, subsequent encounter: Secondary | ICD-10-CM | POA: Diagnosis not present

## 2023-02-09 DIAGNOSIS — L2084 Intrinsic (allergic) eczema: Secondary | ICD-10-CM

## 2023-02-09 DIAGNOSIS — J4530 Mild persistent asthma, uncomplicated: Secondary | ICD-10-CM | POA: Diagnosis not present

## 2023-02-09 DIAGNOSIS — J3089 Other allergic rhinitis: Secondary | ICD-10-CM

## 2023-02-09 DIAGNOSIS — T781XXA Other adverse food reactions, not elsewhere classified, initial encounter: Secondary | ICD-10-CM

## 2023-02-09 MED ORDER — ASMANEX HFA 100 MCG/ACT IN AERO: 2.0000 | INHALATION_SPRAY | Freq: Two times a day (BID) | RESPIRATORY_TRACT | 5 refills | Status: AC

## 2023-02-09 NOTE — Progress Notes (Signed)
 NEW PATIENT Date of Service/Encounter:  02/09/23 Referring provider: none-self referred Primary care provider: No primary care provider on file.  Subjective:  Tamara Jimenez is a 23 y.o. female  presenting today for evaluation of food allergy , rhinitis, asthma  History obtained from: chart review and patient.   Discussed the use of AI scribe software for clinical note transcription with the patient, who gave verbal consent to proceed.  History of Present Illness   Tamara Jimenez is a 23 year old female with food allergies and asthma who presents for established care and evaluation of her allergies.  She has a history of food allergies diagnosed two years ago through testing at [facility] . She avoids pecans based on test results but has not experienced symptoms when consuming them. 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 She experiences an itchy throat when consuming avocado, though inconsistently. No issues with other fresh fruits, vegetables, or bananas, and no history of latex allergy .  She experiences environmental allergies, particularly in the spring, with symptoms of sneezing and a stuffy nose. She does not frequently experience coughing or watery eyes. Her asthma symptoms, including shortness of breath, are exacerbated by exposure to animals, particularly the four cats and two dogs in her home. She sneezes frequently and experiences shortness of breath at home despite taking her medication.  She uses albuterol approximately once every two days for shortness of breath, which provides relief. She has not been on a daily inhaler, nor has she been hospitalized or required urgent care for asthma in the past year. She has not used steroids or antibiotics for respiratory issues in the past year.  She previously underwent allergy   injections for about a year to a year and a half but discontinued due to a lack of insurance. She is open to resuming allergy  injections. She has experienced mild reactions to the shots in the past, such as a small hive at the injection site, but nothing severe.  She had eczema while living in [place] , likely due to the heat, but has not experienced it since moving to her current location four years ago.         Other allergy  screening: Asthma: yes Rhino conjunctivitis: yes Food allergy : yes Medication allergy : no Hymenoptera allergy : no Urticaria: no Eczema: when she lived in [place], resolved since moving to [place] 4 years  History of recurrent infections suggestive of immunodeficency: no Vaccinations are up to date.   Past Medical History: Past Medical History:  Diagnosis Date   Allergy     Anxiety    GERD (gastroesophageal reflux disease)    PCOS (polycystic ovarian syndrome)    Seasonal allergies    Medication List:  Current Outpatient Medications  Medication Sig Dispense Refill   albuterol (VENTOLIN HFA) 108 (90 Base) MCG/ACT inhaler Inhale into the lungs every 6 (six) hours as needed for wheezing or shortness of breath.     azelastine (OPTIVAR) 0.05 % ophthalmic solution 1 drop 2 (two) times daily.     EPINEPHrine 0.3 mg/0.3 mL IJ SOAJ injection Inject 0.3 mg into the muscle as needed for anaphylaxis.     fluticasone  (FLONASE ) 50 MCG/ACT nasal spray Place 2 sprays into both nostrils daily. 16 g 6   levocetirizine (XYZAL) 5 MG tablet Take 5 mg by mouth every evening.     montelukast (SINGULAIR) 10 MG tablet Take 10 mg by mouth at  bedtime.     citalopram  (CELEXA ) 20 MG tablet TAKE 1 TABLET BY MOUTH EVERY DAY 90 tablet 1   No current facility-administered medications for this visit.   Known Allergies:  Allergies  Allergen Reactions   Avocado Itching   Pecan Nut (Diagnostic)    Past Surgical History: History reviewed. No pertinent surgical history. Family History: Family  History  Problem Relation Age of Onset   Diabetes Mother    Heart attack Mother 70   Hypertension Mother    Miscarriages / Stillbirths Mother    Diabetes Father    Kidney failure Father    Heart disease Father    Diabetes Maternal Grandmother    Hypertension Maternal Grandmother    Diabetes Paternal Grandmother    Hypertension Paternal Grandmother    Allergic rhinitis Neg Hx    Angioedema Neg Hx    Asthma Neg Hx    Atopy Neg Hx    Eczema Neg Hx    Immunodeficiency Neg Hx    Social History: Ellianah lives single-family home, no mildew in the house.  Wood throughout.  Gas heating, central cooling.  1 dog and 4 cats cat has access to bedroom and bed.  Roaches in the house and bed is to get off floor.  Dust mite precautions on bed and pillows.  Cigarette exposure inside the home and house.  She does not actively smoke.  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  She is exposed to dust through her job.  HEPA filter in the home home not near an interstate industrial area.   ROS:  All other systems negative except as noted per HPI.  Objective:  Blood pressure 122/72, pulse 94, temperature 97.9 F (36.6 C), temperature source Temporal, resp. rate 18, height 5' 10 (1.778 m), weight (!) 306 lb (138.8 kg), SpO2 96%. Body mass index is 43.91 kg/m. Physical Exam:  General Appearance:  Alert, cooperative, no distress, appears stated age  Head:  Normocephalic, without obvious abnormality, atraumatic  Eyes:  Conjunctiva clear, EOM's intact  Ears EACs normal bilaterally  Nose: Nares normal,  pale edematous nasal mucosa with clear rhinorrhea and no visible anterior polyps, septum deviated to the left  Throat: Lips, tongue normal; teeth and gums normal, + cobblestoning  Neck: Supple, symmetrical  Lungs:   clear to auscultation bilaterally, Respirations unlabored, no coughing  Heart:  regular rate and rhythm and no murmur, Appears well perfused  Extremities: No edema  Skin: Skin color,  texture, turgor normal and no rashes or lesions on visualized portions of skin  Neurologic: No gross deficits   Diagnostics: Spirometry:  Tracings reviewed. Her effort: Good reproducible efforts. FVC: 3.50 L (pre), FEV1: 2.75 L, 69% predicted (pre),  FEV1/FVC ratio: 78 (pre),  Interpretation: Nonobstructive ratio, low FEV1, possible restriction.  Please see scanned spirometry results for details.   Labs:  Lab Orders  No laboratory test(s) ordered today     Assessment and Plan  Assessment and Plan    Food Allergies History of positive allergy  testing to pecans two years ago, but no symptoms with exposure. 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 -- Will get updated and skin lab work for w. r. berkley and avocado  -Continue avoidance for now may consider reintroduction at home or in clinic depending lab and skin testing -Continue to carry EpiPen and follow allergy  action plan which was provided today  Environmental Allergies History of allergic symptoms with exposure to cats and dogs, with four cats and two dogs in  the home. Symptoms include sneezing, stuffy nose, and occasional shortness of breath. -Follow-up in clinic for repeat allergy  testing (1 through 55 and avocado and pecan) -Start allergy  injections, information given on Rush immunotherapy please verify with insurance -Had a detailed discussion with patient/family that clinical history is suggestive of allergic rhinitis, and may benefit from allergy  immunotherapy (AIT). Discussed in detail regarding the dosing, schedule, side effects (mild to moderate local allergic reaction and rarely systemic allergic reactions including anaphylaxis/death), alternatives and benefits (significant improvement in nasal symptoms, seasonal flares of asthma) of immunotherapy with the patient. There is significant time commitment involved with allergy  shots, which includes weekly immunotherapy injections for first 9-12 months and then  biweekly to monthly injections for 3-5 years. Clinical response is often delayed and patient may not see an improvement for 6-12 months. Consent was signed. I have prescribed epinephrine injectable and demonstrated proper use. For mild symptoms you can take over the counter antihistamines such as Benadryl and monitor symptoms closely. If symptoms worsen or if you have severe symptoms including breathing issues, throat closure, significant swelling, whole body hives, severe diarrhea and vomiting, lightheadedness then inject epinephrine and seek immediate medical care afterwards. Action plan given.  Medical Therapy:  -Continue montelukast 10 mg at night Continue levocetirizine 5 mg daily Continue Flonase  1 spray per nostril 1-2 times a day Continue azelastine eyedrops daily as needed   Asthma Symptoms of shortness of breath every other day relieved by albuterol. Mild symptoms at night. - Breathing tests showed: Inflammation in your lungs -Start Asmanex  100 mcg 2 puffs twice daily with spacer.  Rinse mouth out after use -Continue albuterol 2 puffs every 4 hours as needed for cough, wheeze, dyspnea.  Track use - Use a spacer with all inhalers - please keep track of how often you are needing rescue inhaler Albuterol (Proair/Ventolin) as this will help guide future management - Asthma is not controlled if:  - Symptoms are occurring >2 times a week  during the day  OR  - >2 times a month nighttime awakenings  - Please call the clinic to schedule a follow up if these symptoms arise    Eczema History of eczema when living in [place] , but no recent symptoms. -Will continue to monitor   Follow-up: for allergy  testing (1-55, avocado and pecan)      This note in its entirety was forwarded to the Provider who requested this consultation.  Other: reviewed spirometry technique and reviewed inhaler technique  Thank you for your kind referral. I appreciate the opportunity to take part in Romey's  care. Please do not hesitate to contact me with questions.  Sincerely,  Thank you so much for letting me partake in your care today.  Don't hesitate to reach out if you have any additional concerns!  Hargis Springer, MD  [facility] [place] [place]

## 2023-02-09 NOTE — Patient Instructions (Addendum)
 Food Allergies History of positive allergy  testing to pecans two years ago, but no symptoms with exposure. 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 -- Will get updated and skin lab work for w. r. berkley and avocado  -Continue avoidance for now may consider reintroduction at home or in clinic depending lab and skin testing -Continue to carry EpiPen and follow allergy  action plan which was provided today  Environmental Allergies History of allergic symptoms with exposure to cats and dogs, with four cats and two dogs in the home. Symptoms include sneezing, stuffy nose, and occasional shortness of breath. -Follow-up in clinic for repeat allergy  testing (1 through 55 and avocado and pecan) -Start allergy  injections, information given on Rush immunotherapy please verify with insurance -Had a detailed discussion with patient/family that clinical history is suggestive of allergic rhinitis, and may benefit from allergy  immunotherapy (AIT). Discussed in detail regarding the dosing, schedule, side effects (mild to moderate local allergic reaction and rarely systemic allergic reactions including anaphylaxis/death), alternatives and benefits (significant improvement in nasal symptoms, seasonal flares of asthma) of immunotherapy with the patient. There is significant time commitment involved with allergy  shots, which includes weekly immunotherapy injections for first 9-12 months and then biweekly to monthly injections for 3-5 years. Clinical response is often delayed and patient may not see an improvement for 6-12 months. Consent was signed. I have prescribed epinephrine injectable and demonstrated proper use. For mild symptoms you can take over the counter antihistamines such as Benadryl and monitor symptoms closely. If symptoms worsen or if you have severe symptoms including breathing issues, throat closure, significant swelling, whole body hives, severe diarrhea and vomiting, lightheadedness then inject  epinephrine and seek immediate medical care afterwards. Action plan given.  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 Mild symptoms at night. - Breathing tests showed: Inflammation in your lungs -Start Asmanex  100 mcg 2 puffs twice daily with spacer.  Rinse mouth out after use -Continue albuterol 2 puffs every 4 hours as needed for cough, wheeze, dyspnea.  Track use - Use a spacer with all inhalers - please keep track of how often you are needing rescue inhaler Albuterol (Proair/Ventolin) as this will help guide future management - Asthma is not controlled if:  - Symptoms are occurring >2 times a week  during the day  OR  - >2 times a month nighttime awakenings  - Please call the clinic to schedule a follow up if these symptoms arise    Food Allergies History of positive allergy  testing to pecans two years ago, but no symptoms with exposure. 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 -- Will get updated and skin lab work for w. r. berkley and avocado  -Continue avoidance for now may consider reintroduction at home or in clinic depending lab and skin testing -Continue to carry EpiPen and follow allergy  action plan which was provided today  Environmental Allergies History of allergic symptoms with exposure to cats and dogs, with four cats and two dogs in the home. Symptoms include sneezing, stuffy nose, and occasional shortness of breath. -Follow-up in clinic for repeat allergy  testing (1 through 55 and avocado and pecan) -Start allergy  injections, information given on Rush immunotherapy please verify with insurance -Had a detailed discussion with patient/family that clinical history is suggestive of  allergic rhinitis,  and may benefit from allergy  immunotherapy (AIT). Discussed in detail regarding the dosing, schedule, side effects (mild to moderate local allergic reaction and rarely systemic allergic reactions including anaphylaxis/death), alternatives and benefits (significant improvement in nasal symptoms, seasonal flares of asthma) of immunotherapy with the patient. There is significant time commitment involved with allergy  shots, which includes weekly immunotherapy injections for first 9-12 months and then biweekly to monthly injections for 3-5 years. Clinical response is often delayed and patient may not see an improvement for 6-12 months. Consent was signed. I have prescribed epinephrine injectable and demonstrated proper use. For mild symptoms you can take over the counter antihistamines such as Benadryl and monitor symptoms closely. If symptoms worsen or if you have severe symptoms including breathing issues, throat closure, significant swelling, whole body hives, severe diarrhea and vomiting, lightheadedness then inject epinephrine and seek immediate medical care afterwards. Action plan given.  Medical Therapy:  -Continue montelukast 10 mg at night Continue levocetirizine 5 mg daily Continue Flonase  1 spray per nostril 1-2 times a day Continue azelastine eyedrops daily as needed   Asthma Symptoms of shortness of breath every other day relieved by albuterol. Mild symptoms at night. - Breathing tests showed: Inflammation in your lungs -Start Asmanex  100 mcg 2 puffs twice daily with spacer.  Rinse mouth out after use -Continue albuterol 2 puffs every 4 hours as needed for cough, wheeze, dyspnea.  Track use - Use a spacer with all inhalers - please keep track of how often you are needing rescue inhaler Albuterol (Proair/Ventolin) as this will help guide future management - Asthma is not controlled if:  - Symptoms are occurring >2 times a week  during the day  OR  - >2 times a month nighttime awakenings  -  Please call the clinic to schedule a follow up if these symptoms arise    Eczema History of eczema when living in [place] , but no recent symptoms. -Will continue to monitor   Follow-up: for allergy  testing (1-55, avocado and pecan)  History of eczema when living in [place] , but no recent symptoms. -Will continue to monitor   Follow-up: for allergy  testing (1-55, avocado and pecan)   Thank you so much for letting me partake in your care today.  Don't hesitate to reach out if you have any additional concerns!  Hargis Springer, MD  [facility] [place]

## 2023-02-12 LAB — IGE NUT PROF. W/COMPONENT RFLX

## 2023-02-13 LAB — PANEL 604726
Cor A 1 IgE: 0.38 kU/L — AB
Cor A 14 IgE: <0.1 kU/L
Cor A 8 IgE: <0.1 kU/L
Cor A 9 IgE: <0.1 kU/L

## 2023-02-13 LAB — IGE NUT PROF. W/COMPONENT RFLX
F017-IgE Hazelnut (Filbert): 0.36 kU/L — AB
F202-IgE Cashew Nut: 0.24 kU/L — AB
F256-IgE Walnut: 0.24 kU/L — AB
Jug R 3 IgE: 0.4 kU/L — AB
Macadamia Nut, IgE: 0.11 kU/L — AB

## 2023-02-13 LAB — PANEL 604721
Jug R 1 IgE: <0.1 kU/L
Jug R 3 IgE: <0.1 kU/L

## 2023-02-13 LAB — ALLERGEN COMPONENT COMMENTS

## 2023-02-13 LAB — PANEL 604239: ANA O 3 IgE: <0.1 kU/L

## 2023-02-13 LAB — ALLERGEN AVOCADO F96: F096-IgE Avocado: <0.1 kU/L

## 2023-02-16 ENCOUNTER — Ambulatory Visit: Payer: Managed Care, Other (non HMO) | Admitting: Internal Medicine

## 2023-02-23 ENCOUNTER — Ambulatory Visit: Payer: Managed Care, Other (non HMO) | Admitting: Internal Medicine

## 2023-03-02 ENCOUNTER — Ambulatory Visit: Payer: Managed Care, Other (non HMO) | Admitting: Internal Medicine

## 2023-03-02 DIAGNOSIS — L2084 Intrinsic (allergic) eczema: Secondary | ICD-10-CM

## 2023-03-02 DIAGNOSIS — T781XXD Other adverse food reactions, not elsewhere classified, subsequent encounter: Secondary | ICD-10-CM

## 2023-03-02 DIAGNOSIS — J3089 Other allergic rhinitis: Secondary | ICD-10-CM

## 2023-03-02 NOTE — Patient Instructions (Addendum)
 Marland Kitchen

## 2023-03-02 NOTE — Progress Notes (Signed)
 Date of Service/Encounter:  03/02/23  Allergy testing appointment   Initial visit on 02/09/23, seen for pollen food syndrome, allergic rhinitis, eczema food allergies.  Please see that note for additional details.  Today reports for allergy diagnostic testing:    DIAGNOSTICS:  Skin Testing: Environmental allergy panel and select foods. Adequate positive and negative controls Results discussed with patient/family.  Airborne Adult Perc - 03/02/23 1526     Time Antigen Placed 1526    Allergen Manufacturer Waynette Buttery    Location Back    Number of Test 55    1. Control-Buffer 50% Glycerol Negative    2. Control-Histamine 4+    3. Bahia Negative    4. [place] Negative    5. Johnson Negative    6. Kentucky Blue Negative    7. Meadow Fescue Negative    8. Perennial Rye Negative    9. Timothy Negative    10. Ragweed Mix Negative    11. Cocklebur Negative    12. Plantain,  English Negative    13. Baccharis Negative    14. Dog Fennel Negative    15. Russian Thistle Negative    16. Lamb's Quarters Negative    17. Sheep Sorrell Negative    18. Rough Pigweed Negative    19. Marsh Elder, Rough Negative    20. Mugwort, Common Negative    21. Box, Elder Negative    22. Cedar, red Negative    23. Sweet Gum Negative    24. Pecan Pollen 4+    25. Pine Mix Negative    26. Walnut, Black Pollen 4+    27. Red Mulberry Negative    28. Ash Mix Negative    29. Birch Mix 4+    30. Beech American 3+    31. Cottonwood, [place] Negative    32. Hickory, White 4+    33. Maple Mix Negative    34. Oak, [place] Mix 4+    35. Sycamore Eastern 3+    36. Alternaria Alternata Negative    37. Cladosporium Herbarum Negative    38. Aspergillus Mix Negative    39. Penicillium Mix Negative    40. Bipolaris Sorokiniana (Helminthosporium) Negative    41. Drechslera Spicifera (Curvularia) Negative    42. Mucor Plumbeus Negative    43. Fusarium Moniliforme Negative    44. Aureobasidium Pullulans (pullulara)  Negative    45. Rhizopus Oryzae Negative    46. Botrytis Cinera Negative    47. Epicoccum Nigrum Negative    48. Phoma Betae Negative    49. Dust Mite Mix 4+    50. Cat Hair 10,000 BAU/ml 4+    51.  Dog Epithelia 4+    52. Mixed Feathers Negative    53. Horse Epithelia 4+    54. Cockroach, German Negative    55. Tobacco Leaf Negative             Intradermal - 03/02/23 1656     Time Antigen Placed 1656    Allergen Manufacturer Other    Location Arm    Number of Test 12    Control 3+    Bahia 3+    [place] 3+    Johnson 3+    7 Grass 3+    Ragweed Mix 3+    Weed Mix 3+    Mold 1 3+    Mold 2 3+    Mold 3 3+    Mold 4 3+    Cockroach 3+  Food Adult Perc - 03/02/23 1500     Time Antigen Placed 1527    Allergen Manufacturer Waynette Buttery    Location Back    Number of allergen test 2    14. Pecan Food Negative    48. Avocado Negative             Allergy testing results were read and interpreted by myself, documented by clinical staff.  Patient provided with copy of allergy testing along with avoidance measures when indicated.   Patient scheduled for RUSH immunotherapy.    Ferol Luz, MD  [facility]

## 2023-03-03 NOTE — Progress Notes (Signed)
VIALS NOT MADE UNTIL APPT SCHED. 

## 2023-03-03 NOTE — Progress Notes (Signed)
 Aeroallergen Immunotherapy  Ordering Provider: Dr. Ferol Luz  Patient Details Name: Tamara Jimenez MRN: [id number] Date of Birth: 10-Oct-2000  Order 2 of 2  Vial Label: DM-M-R  0.2 ml (Volume)  1:20 Concentration -- Alternaria alternata 0.2 ml (Volume)  1:20 Concentration -- Cladosporium herbarum 0.2 ml (Volume)  1:10 Concentration -- Aspergillus mix 0.2 ml (Volume)  1:10 Concentration -- Penicillium mix 0.2 ml (Volume)  1:20 Concentration -- Bipolaris sorokiniana 0.2 ml (Volume)  1:20 Concentration -- Drechslera spicifera 0.2 ml (Volume)  1:10 Concentration -- Mucor plumbeus 0.2 ml (Volume)  1:10 Concentration -- Fusarium moniliforme 0.2 ml (Volume)  1:40 Concentration -- Aureobasidium pullulans 0.2 ml (Volume)  1:10 Concentration -- Rhizopus oryzae 0.3 ml (Volume)  1:20 Concentration -- Cockroach, German 0.5 ml (Volume)   AU Concentration -- Mite Mix (DF 5,000 & DP 5,000)   2.8  ml Extract Subtotal 2.2  ml Diluent 5.0  ml Maintenance Total  Schedule:  RUSH Silver Vial (1:1,000,000): RUSH (6 doses) Blue Vial (1:100,000): RUSH ( 6 doses) Yellow Vial (1:10,000): RUSH (6 doses) Green Vial (1:1,000): Schedule B (6 doses) Red Vial (1:100): Schedule A (14 doses)  Special Instructions: After completion of the first Red Vial, please space to every two weeks. After completion of the second Red Vial, please space to every 4 weeks. Ok to up dose new vials at 0.20mL --> 0.3 mL --> 0.5 mL. Ok to come twice weekly, if desired, as long as there is 48 hours between injections. Maintenance vials (11 doses)

## 2023-03-03 NOTE — Progress Notes (Signed)
 Aeroallergen Immunotherapy  Ordering Provider: Dr. Ferol Luz  Patient Details Name: Alletta Mattos MRN: [id number] Date of Birth: Oct 03, 2000  Order 1 of 2  Vial Label: G-[id number]-T-[id number]-D  0.3 ml (Volume)  BAU Concentration -- 7 Grass Mix* 100,000 ([address] [place], [place], [place], [place] Rye, RedTop, Sweet Vernal, Timothy) 0.2 ml (Volume)  1:20 Concentration -- Bahia 0.3 ml (Volume)  BAU Concentration -- [place] 10,000 0.2 ml (Volume)  1:20 Concentration -- Johnson 0.3 ml (Volume)  1:20 Concentration -- Ragweed Mix 0.5 ml (Volume)  1:20 Concentration -- Weed Mix* 0.5 ml (Volume)  1:20 Concentration -- Eastern 10 Tree Mix (also Sweet Gum) 0.2 ml (Volume)  1:10 Concentration -- Pecan Pollen 0.2 ml (Volume)  1:20 Concentration -- Walnut, Black Pollen 0.5 ml (Volume)  1:10 Concentration -- Cat Hair 0.5 ml (Volume)  1:10 Concentration -- Dog Epithelia   3.7  ml Extract Subtotal 1.3  ml Diluent 5.0  ml Maintenance Total  Schedule:  RUSH Silver Vial (1:1,000,000): RUSH (6 doses) Blue Vial (1:100,000): RUSH (6 doses) Yellow Vial (1:10,000): RUSH (6 doses) Green Vial (1:1,000): Schedule B (6 doses) Red Vial (1:100): Schedule A (14 doses)  Special Instructions: After completion of the first Red Vial, please space to every two weeks. After completion of the second Red Vial, please space to every 4 weeks. Ok to up dose new vials at 0.7mL --> 0.3 mL --> 0.5 mL. Ok to come twice weekly, if desired, as long as there is 48 hours between injections. For maintenance vials (11 doses)

## 2023-03-07 DIAGNOSIS — J3081 Allergic rhinitis due to animal (cat) (dog) hair and dander: Secondary | ICD-10-CM | POA: Diagnosis not present

## 2023-03-07 NOTE — Progress Notes (Signed)
 VIAL SET 1 MADE ON 03/07/23 EXP 03/06/24

## 2023-03-08 DIAGNOSIS — J3089 Other allergic rhinitis: Secondary | ICD-10-CM | POA: Diagnosis not present

## 2023-03-08 NOTE — Progress Notes (Signed)
 VIAL SET 2 MADE 03-08-23. EXP 03-07-24.

## 2023-03-14 ENCOUNTER — Telehealth: Payer: Self-pay

## 2023-03-14 MED ORDER — FAMOTIDINE 20 MG PO TABS: ORAL_TABLET | ORAL | 0 refills | Status: AC

## 2023-03-14 MED ORDER — PREDNISONE 20 MG PO TABS: ORAL_TABLET | ORAL | 0 refills | Status: AC

## 2023-03-14 NOTE — Telephone Encounter (Signed)
 Pt was called LVM about RUSH info and pre-meds sent to [facility]. Info alse sent to MyChart.

## 2023-03-25 ENCOUNTER — Ambulatory Visit: Payer: Managed Care, Other (non HMO) | Admitting: Internal Medicine

## 2023-03-25 VITALS — BP 136/82 | HR 76 | Temp 98.6°F | Resp 16

## 2023-03-25 DIAGNOSIS — J3089 Other allergic rhinitis: Secondary | ICD-10-CM | POA: Diagnosis not present

## 2023-03-25 DIAGNOSIS — J302 Other seasonal allergic rhinitis: Secondary | ICD-10-CM

## 2023-03-25 NOTE — Progress Notes (Signed)
 RAPID DESENSITIZATION Note  RE: Swan Fairfax MRN: [id number] DOB: [birth year] Date of Office Visit: 03/25/2023  Subjective:  Patient presents today for rapid desensitization.  Interval History: Patient has not been ill, she has taken all premedications as per protocol.  Recent/Current History: Pulmonary disease: no Cardiac disease: no Respiratory infection: no Rash: no Itch: no Swelling: no Cough: no Shortness of breath: no Runny/stuffy nose: no Itchy eyes: no Beta-blocker use: no  Patient/guardian was informed of the procedure with verbalized understanding of the risk of anaphylaxis. Consent has been signed.   Medication List:  Current Outpatient Medications  Medication Sig Dispense Refill   albuterol (VENTOLIN HFA) 108 (90 Base) MCG/ACT inhaler Inhale into the lungs every 6 (six) hours as needed for wheezing or shortness of breath.     azelastine (OPTIVAR) 0.05 % ophthalmic solution 1 drop 2 (two) times daily.     citalopram (CELEXA) 20 MG tablet TAKE 1 TABLET BY MOUTH EVERY DAY 90 tablet 1   EPINEPHrine 0.3 mg/0.3 mL IJ SOAJ injection Inject 0.3 mg into the muscle as needed for anaphylaxis.     famotidine (PEPCID) 20 MG tablet Take 1 tab twice daily on Thursday and Friday before RUSH appt. 4 tablet 0   fluticasone (FLONASE) 50 MCG/ACT nasal spray Place 2 sprays into both nostrils daily. 16 g 6   levocetirizine (XYZAL) 5 MG tablet Take 5 mg by mouth every evening.     Mometasone Furoate (ASMANEX HFA) 100 MCG/ACT AERO Inhale 2 puffs into the lungs 2 (two) times daily. 13 g 5   montelukast (SINGULAIR) 10 MG tablet Take 10 mg by mouth at bedtime.     predniSONE (DELTASONE) 20 MG tablet Take 2 tabs Thursday and Friday morning before RUSH appt. 4 tablet 0   No current facility-administered medications for this visit.   Allergies: Allergies  Allergen Reactions   Avocado Itching   Pecan Nut (Diagnostic)    I reviewed her past medical history, social history,  family history, and environmental history and no significant changes have been reported from her previous visit.  ROS: Negative except as per HPI.  Objective: BP 116/82   Pulse 70   Temp 97.7 F (36.5 C) (Temporal)   Resp 17   SpO2 97%  There is no height or weight on file to calculate BMI.   General Appearance:  Alert, cooperative, no distress, appears stated age  Head:  Normocephalic, without obvious abnormality, atraumatic  Eyes:  Conjunctiva clear, EOM's intact  Nose: Nares normal  Throat: Lips, tongue normal; teeth and gums normal, normal  posterior oropharnyx  Neck: Supple, symmetrical  Lungs:   CTAB, Respirations unlabored, no coughing  Heart:  Appears well perfused  Extremities: No edema  Skin: Skin color, texture, turgor normal, no rashes or lesions on visualized portions of skin  Neurologic: No gross deficits     Diagnostics: none  PROCEDURES:  Patient received the following doses every hour: Step 1:  0.15ml - 1:1,000,000 dilution (silver vial) Step 2:  0.97ml - 1:1,000,000 dilution (silver vial) Step 3: 0.21ml - 1:100,000 dilution (blue vial)  Step 4: 0.93ml - 1:100,000 dilution (blue vial)  Step 5: 0.62ml - 1:10,000 dilution (gold vial) Step 6: 0.64ml - 1:10,000 dilution (gold vial) Step 7: 0.59ml - 1:10,000 dilution (gold vial) Step 8: 0.63ml - 1:10,000 dilution (gold vial)  Patient was observed for 1 hour after the last dose.   Procedure started at 9:00 Procedure ended at 3:50   ASSESSMENT/PLAN:   Patient has  tolerated the rapid desensitization protocol.  Next appointment: Start at 0.30ml of 1:1000 dilution (green vial) and build up per protocol.

## 2023-04-01 ENCOUNTER — Ambulatory Visit (INDEPENDENT_AMBULATORY_CARE_PROVIDER_SITE_OTHER): Payer: Self-pay

## 2023-04-01 DIAGNOSIS — J309 Allergic rhinitis, unspecified: Secondary | ICD-10-CM

## 2023-04-08 ENCOUNTER — Ambulatory Visit (INDEPENDENT_AMBULATORY_CARE_PROVIDER_SITE_OTHER)

## 2023-04-08 DIAGNOSIS — J309 Allergic rhinitis, unspecified: Secondary | ICD-10-CM

## 2023-04-20 ENCOUNTER — Other Ambulatory Visit: Payer: Self-pay
